# Patient Record
Sex: Male | Born: 1975 | Hispanic: Yes | Marital: Single | Smoking: Current some day smoker
Health system: Southern US, Community
[De-identification: ages and names within clinical notes are randomized; demographics above are authoritative.]

## PROBLEM LIST (undated history)

## (undated) DIAGNOSIS — L509 Urticaria, unspecified: Secondary | ICD-10-CM

## (undated) HISTORY — PX: SHOULDER CLOSED REDUCTION: SHX1051

## (undated) HISTORY — DX: Urticaria, unspecified: L50.9

---

## 2000-12-12 ENCOUNTER — Emergency Department (HOSPITAL_COMMUNITY): Admission: EM | Admit: 2000-12-12 | Discharge: 2000-12-12 | Payer: Self-pay | Admitting: Emergency Medicine

## 2000-12-12 ENCOUNTER — Encounter: Payer: Self-pay | Admitting: Emergency Medicine

## 2003-06-12 ENCOUNTER — Ambulatory Visit (HOSPITAL_COMMUNITY): Admission: RE | Admit: 2003-06-12 | Discharge: 2003-06-12 | Payer: Self-pay | Admitting: Internal Medicine

## 2003-06-12 IMAGING — CT CT PARANASAL SINUSES LIMITED
1 of 2 series · 12 of 28 positions shown, 15 images · non-contrast
Comparison: none

CLINICAL DATA: Frontal headaches, nasal drainage.
 LIMITED CT OF THE PARANASAL SINUSES WITHOUT CONTRAST 
 Prone coronal images of the paranasal sinuses demonstrate no mucosal thickening or air fluid level.  There is no bone abnormality.  There is nasal septal deviation to the left. 
 IMPRESSION
 1.  Clear paranasal sinuses. 
 2.  Nasal septal deviation to the left.

[Series 4: — · axial · 0.23mm/px · z∈[+1331,+1441]mm · 12 of 14 slices shown, 15 images]
[im 2/14  brain]
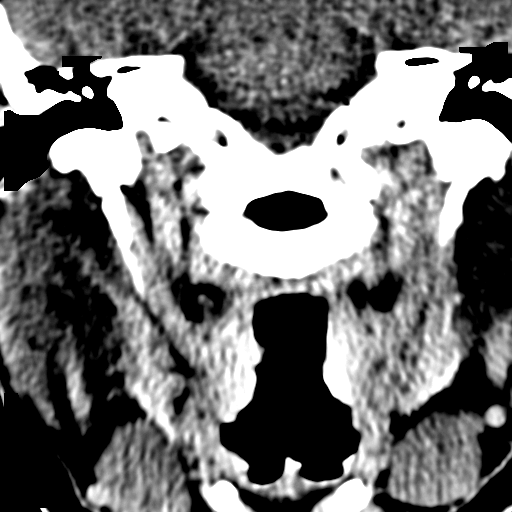
[im 2/14  bone]
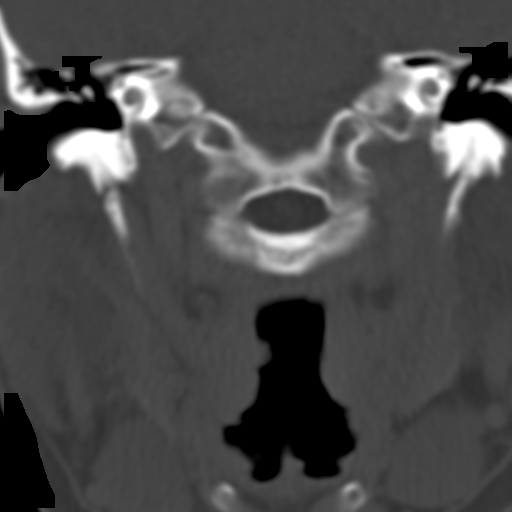
[im 3/14  bone]
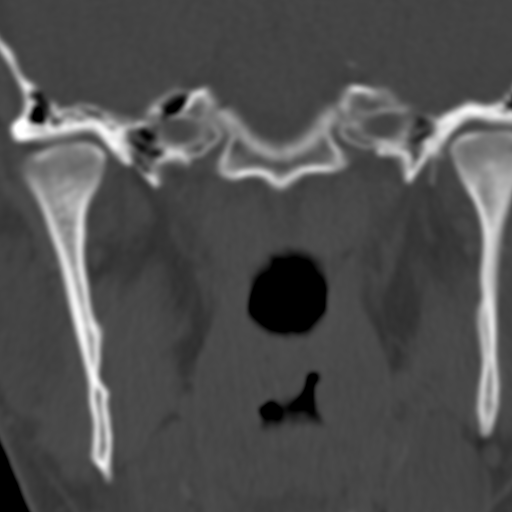
[im 4/14  bone]
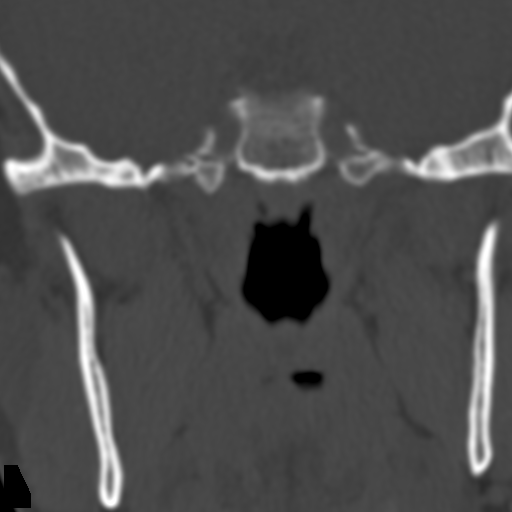
[im 5/14  bone]
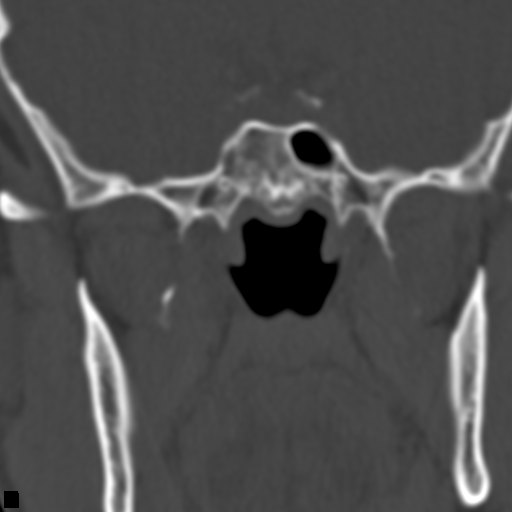
[im 6/14  brain]
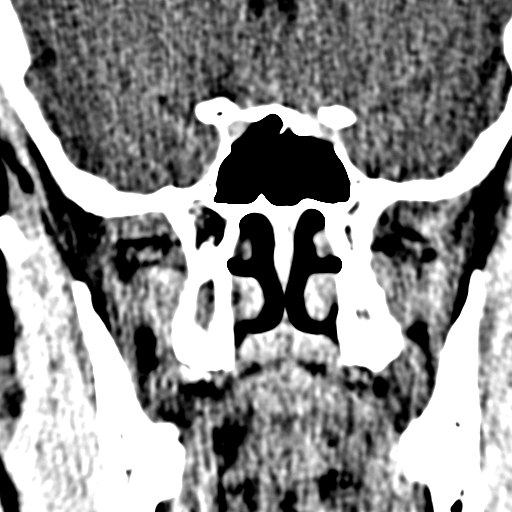
[im 6/14  bone]
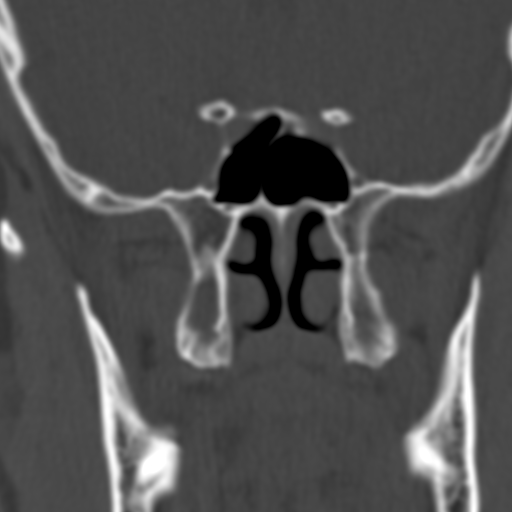
[im 7/14  bone]
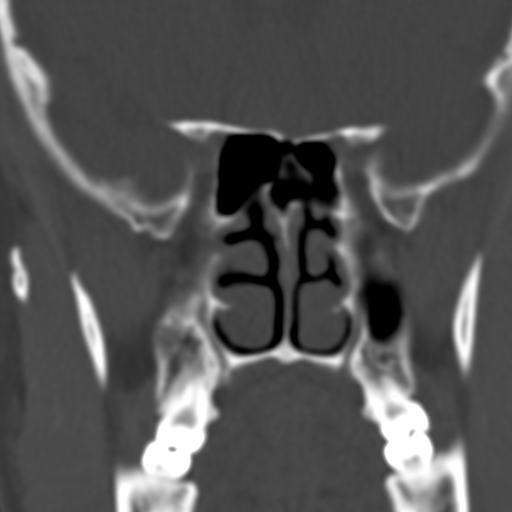
[im 8/14  bone]
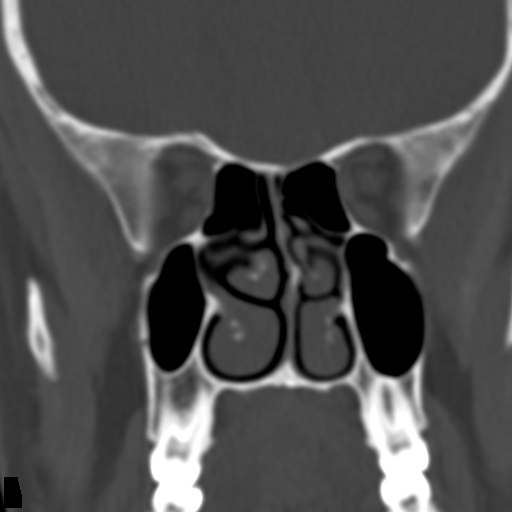
[im 9/14  bone]
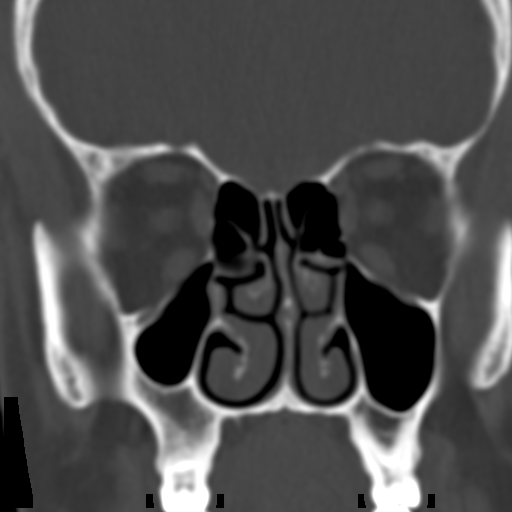
[im 10/14  brain]
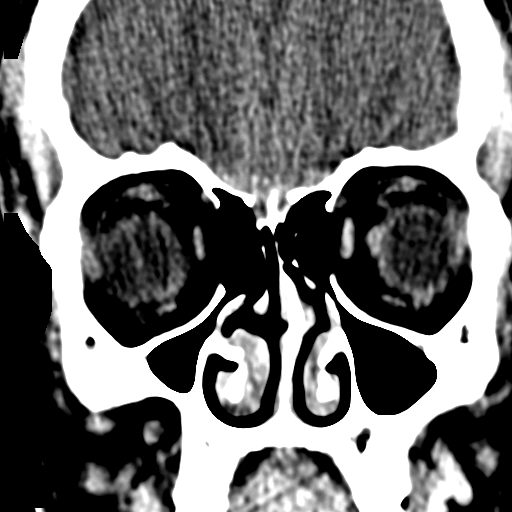
[im 10/14  bone]
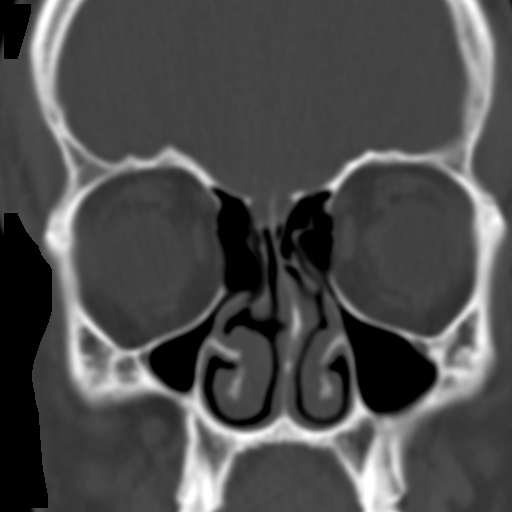
[im 11/14  bone]
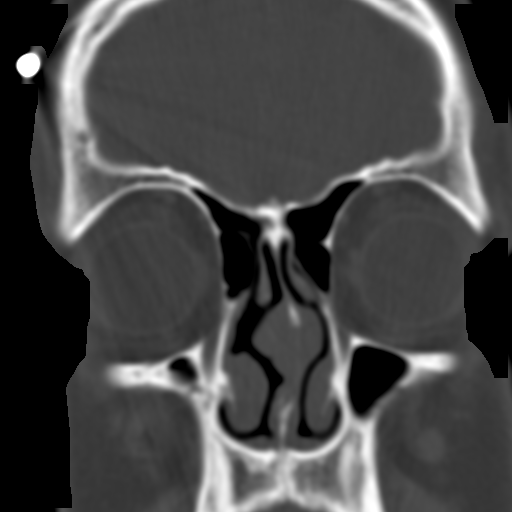
[im 12/14  bone]
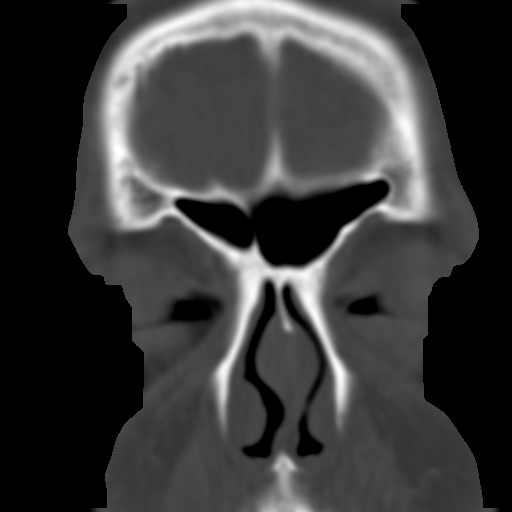
[im 13/14  bone]
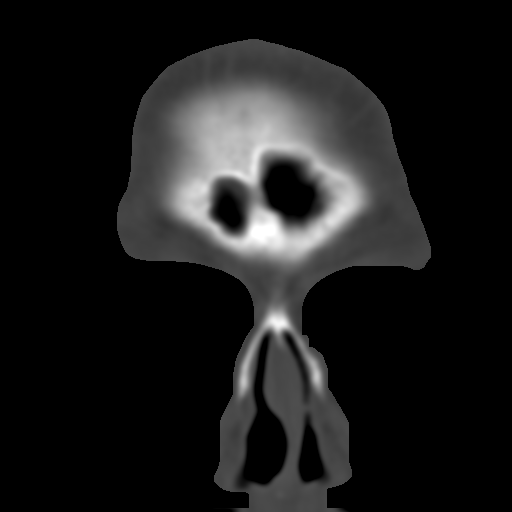

[12 of 28 positions shown; findings below may reference images not displayed]

## 2003-07-20 ENCOUNTER — Ambulatory Visit (HOSPITAL_COMMUNITY): Admission: RE | Admit: 2003-07-20 | Discharge: 2003-07-20 | Payer: Self-pay | Admitting: Nurse Practitioner

## 2003-08-16 ENCOUNTER — Ambulatory Visit (HOSPITAL_COMMUNITY): Admission: RE | Admit: 2003-08-16 | Discharge: 2003-08-16 | Payer: Self-pay | Admitting: Internal Medicine

## 2003-10-31 ENCOUNTER — Ambulatory Visit (HOSPITAL_BASED_OUTPATIENT_CLINIC_OR_DEPARTMENT_OTHER): Admission: RE | Admit: 2003-10-31 | Discharge: 2003-10-31 | Payer: Self-pay | Admitting: General Surgery

## 2003-10-31 ENCOUNTER — Ambulatory Visit (HOSPITAL_COMMUNITY): Admission: RE | Admit: 2003-10-31 | Discharge: 2003-10-31 | Payer: Self-pay | Admitting: General Surgery

## 2004-02-16 ENCOUNTER — Ambulatory Visit (HOSPITAL_COMMUNITY): Admission: RE | Admit: 2004-02-16 | Discharge: 2004-02-16 | Payer: Self-pay | Admitting: Internal Medicine

## 2010-07-24 ENCOUNTER — Encounter: Payer: Self-pay | Admitting: Internal Medicine

## 2013-01-23 ENCOUNTER — Ambulatory Visit: Payer: Self-pay | Admitting: Emergency Medicine

## 2013-01-23 VITALS — BP 104/90 | HR 81 | Temp 98.8°F | Resp 18 | Ht 69.0 in | Wt 227.0 lb

## 2013-01-23 DIAGNOSIS — L509 Urticaria, unspecified: Secondary | ICD-10-CM

## 2013-01-23 MED ORDER — PREDNISONE 20 MG PO TABS: ORAL_TABLET | ORAL | Status: DC

## 2013-01-23 NOTE — Patient Instructions (Addendum)
Ronchas  (Hives)  Las ronchas son reas de la piel inflamadas (hinchadas) rojas y que pican. Pueden cambiar de tamao y de ubicacin en el cuerpo. Las Armed forces operational officer y Geneticist, molecular durante algunas horas o das (ronchas agudas) o durante algunas semanas (ronchas crnicas). No pueden transmitirse de [place] persona a Theodoro Clock (no son contagiosas). Pueden empeorar al rascarse, hacer ejercicios y por estrs emocional.  CAUSAS   Reaccin alrgica a alimentos, aditivos o frmacos.  Infecciones, incluso el resfro comn.  Enfermedades, como la vasculitis, el lupus o la enfermedad tiroidea.  Exposicin al sol, al calor o al fro.  La prctica de ejercicios.  El estrs.  El contacto con algunas sustancias qumicas. SNTOMAS   Zonas hinchadas, rojas o blancas, sobre la piel. Las ronchas pueden cambiar de [place], forma, [place] y Armed forces logistics/support/administrative officer.  Picazn.  Hinchazn de las [employer] y [place]. Esto puede ocurrir si las ronchas se desarrollan en capas profundas de la piel. DIAGNSTICO  El mdico puede diagnosticar el problema haciendo un examen fsico. Conley Rolls indicar anlisis de sangre o un estudio de la piel para Production assistant, radio causa. En algunos casos, no puede determinarse la causa.  TRATAMIENTO  Los casos leves generalmente mejoran con medicamentos como los antihistamnicos. Los casos ms graves pueden requerir una inyeccin de epinefrina de Associate Professor. Si se conoce la causa de la urticaria, el tratamiento incluye evitar el factor desencadenante.  INSTRUCCIONES PARA EL CUIDADO EN EL HOGAR   Evite las causas que han desencadenado las ronchas.  Tome los antihistamnicos segn las indicaciones del mdico para reducir la gravedad de las ronchas. Generalmente se recomiendan los [employer] no son sedantes o con bajo efecto sedante. No conduzca vehculos mientras toma antihistamnicos.  Tome los medicamentos para la picazn exactamente como le indic el  mdico.  Use ropas sueltas.  Cumpla con todas las visitas de control, segn le indique su mdico. SOLICITE ATENCIN MDICA SI:   Siente una picazn intensa o persistente que no se calma con los medicamentos.  Le duelen las articulaciones o estn inflamadas. SOLICITE ATENCIN MDICA DE INMEDIATO SI:   Tiene fiebre.  Tiene la boca o los labios hinchados.  Tiene problemas para respirar o tragar.  Siente una opresin en la garganta o en el pecho.  Siente dolor abdominal. Estos problemas pueden ser los primeros signos de una reaccin alrgica que ponga en peligro la vida. Llame a los servicios de emergencia locales (911 en los [place]). ASEGRESE DE QUE:   Comprende estas instrucciones.  Controlar su enfermedad.  Solicitar ayuda de inmediato si no mejora o si empeora. Document Released: 06/20/2005 Document Revised: 12/20/2011 [facility] Patient Information 2014 [place], [place]. Take Zyrtec 10 mg one a day starting tomorrow. He can take Benadryl 1-2 tablets at night as needed. Take the prednisone as instructed.

## 2013-01-23 NOTE — Progress Notes (Signed)
  Subjective:    Patient ID: Jay Ruiz, male    DOB: September 04, 1975, 37 y.o.   MRN: [id number]  HPI patient was in his usual state of health until yesterday when around 12 noon he ate tacos from a booth at the work site . 2 hours later he developed severe itching and discomfort in his axilla of both arms. He also felt some itching in the back of his right ear. He did not have any shortness of breath. He did not have any wheezing. He did not feel any swelling or discomfort in his throat. He did not develop this on his extremities or trunk. 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 The area of hair growth in both arms are spared. There is also redness and swelling behind the right ear. His throat is normal his chest is clear to auscultation and percussion.        Assessment & Plan:  Patient given 60 mg of Prelone here in the office. Patient given 10 mg of Zyrtec here in the office.  He will be  on Zyrtec 10 mg daily and a prednisone taper over the next week. He was told to avoid eating any tacos from this particular vendor.Marland Kitchen

## 2016-08-26 DIAGNOSIS — K644 Residual hemorrhoidal skin tags: Secondary | ICD-10-CM | POA: Insufficient documentation

## 2016-08-26 DIAGNOSIS — K219 Gastro-esophageal reflux disease without esophagitis: Secondary | ICD-10-CM | POA: Insufficient documentation

## 2016-08-26 DIAGNOSIS — L720 Epidermal cyst: Secondary | ICD-10-CM | POA: Insufficient documentation

## 2016-09-16 DIAGNOSIS — B353 Tinea pedis: Secondary | ICD-10-CM | POA: Insufficient documentation

## 2016-09-16 DIAGNOSIS — L301 Dyshidrosis [pompholyx]: Secondary | ICD-10-CM | POA: Insufficient documentation

## 2016-09-16 HISTORY — DX: Dyshidrosis (pompholyx): L30.1

## 2016-11-29 DIAGNOSIS — R001 Bradycardia, unspecified: Secondary | ICD-10-CM | POA: Insufficient documentation

## 2017-01-10 DIAGNOSIS — M19049 Primary osteoarthritis, unspecified hand: Secondary | ICD-10-CM | POA: Insufficient documentation

## 2017-08-28 DIAGNOSIS — R002 Palpitations: Secondary | ICD-10-CM | POA: Insufficient documentation

## 2017-10-23 DIAGNOSIS — F1411 Cocaine abuse, in remission: Secondary | ICD-10-CM | POA: Insufficient documentation

## 2017-10-23 DIAGNOSIS — F1011 Alcohol abuse, in remission: Secondary | ICD-10-CM | POA: Insufficient documentation

## 2017-10-23 DIAGNOSIS — I209 Angina pectoris, unspecified: Secondary | ICD-10-CM | POA: Insufficient documentation

## 2018-01-29 DIAGNOSIS — E785 Hyperlipidemia, unspecified: Secondary | ICD-10-CM | POA: Insufficient documentation

## 2018-05-24 ENCOUNTER — Encounter (HOSPITAL_COMMUNITY): Payer: Self-pay | Admitting: *Deleted

## 2018-05-24 ENCOUNTER — Other Ambulatory Visit: Payer: Self-pay

## 2018-05-24 ENCOUNTER — Emergency Department (HOSPITAL_COMMUNITY)
Admission: EM | Admit: 2018-05-24 | Discharge: 2018-05-24 | Disposition: A | Discharge status: Home / Self Care | Payer: Self-pay | Attending: Emergency Medicine | Admitting: Emergency Medicine

## 2018-05-24 ENCOUNTER — Emergency Department (HOSPITAL_COMMUNITY): Payer: Self-pay

## 2018-05-24 DIAGNOSIS — M5442 Lumbago with sciatica, left side: Secondary | ICD-10-CM | POA: Insufficient documentation

## 2018-05-24 DIAGNOSIS — X500XXA Overexertion from strenuous movement or load, initial encounter: Secondary | ICD-10-CM | POA: Insufficient documentation

## 2018-05-24 DIAGNOSIS — F17200 Nicotine dependence, unspecified, uncomplicated: Secondary | ICD-10-CM | POA: Insufficient documentation

## 2018-05-24 DIAGNOSIS — Y990 Civilian activity done for income or pay: Secondary | ICD-10-CM | POA: Insufficient documentation

## 2018-05-24 DIAGNOSIS — Y9389 Activity, other specified: Secondary | ICD-10-CM | POA: Insufficient documentation

## 2018-05-24 DIAGNOSIS — Y9289 Other specified places as the place of occurrence of the external cause: Secondary | ICD-10-CM | POA: Insufficient documentation

## 2018-05-24 MED ORDER — OXYCODONE-ACETAMINOPHEN 5-325 MG PO TABS
1.0000 | ORAL_TABLET | Freq: Once | ORAL | Status: AC
  Administered 2018-05-24: 1 via ORAL
  Filled 2018-05-24: qty 1

## 2018-05-24 MED ORDER — PREDNISONE 10 MG (21) PO TBPK: ORAL_TABLET | Freq: Every day | ORAL | 0 refills | Status: DC

## 2018-05-24 NOTE — ED Provider Notes (Signed)
[facility] Provider Note   CSN: [id number] Arrival date & time: 05/24/18  0741     History   Chief Complaint Chief Complaint  Patient presents with  . Back Pain    HPI Jay Ruiz is a 42 y.o. male.  HPI 42 year old male, with PMH of HTN, presents with back pain.  Patient states pain started suddenly after lifting something heavy at work.  He describes pain as low back pain radiating down bilateral legs with the pain worst on the left leg than the right leg.  He denies any associated saddle anesthesias, bowel or bladder incontinence, overflow incontinence.  He states pain is worse with ambulating.  He denies any numbness, tingling, difficulty ambulating, decreased range of motion of bilateral lower extremities.  He denies any direct injury or trauma to the back.  He denies any fevers, chills, nausea, vomiting, chest pain, shortness of breath, abdominal pain, dysuria, hematuria.  History reviewed. No pertinent past medical history.  There are no active problems to display for this patient.   Past Surgical History:  Procedure Laterality Date  . SHOULDER CLOSED REDUCTION          Home Medications    Prior to Admission medications   Medication Sig Start Date End Date Taking? Authorizing Provider  predniSONE (DELTASONE) 20 MG tablet On Thursday, 01/24/2013 take 3 tablets a day for 2 days then 2 tablets a day for 3 days then 1 tablet a day for 3 days 01/23/13   Collene Gobbleaub, Steven A, MD    Family History No family history on file.  Social History Social History   Tobacco Use  . Smoking status: Current Some Day Smoker  . Smokeless tobacco: Never Used  Substance Use Topics  . Alcohol use: Yes  . Drug use: Yes     Allergies   Patient has no known allergies.   Review of Systems Review of Systems  Constitutional: Negative for chills and fever.  HENT: Negative for rhinorrhea.   Respiratory: Negative for shortness of breath.     Cardiovascular: Negative for chest pain.  Gastrointestinal: Negative for abdominal pain, nausea and vomiting.  Genitourinary: Negative for hematuria.  Musculoskeletal: Positive for back pain.  Skin: Negative for rash and wound.     Physical Exam Updated Vital Signs BP 126/84 (BP Location: Right Arm)   Pulse 73   Temp 99.1 F (37.3 C) (Oral)   Resp 16   Ht 5\' 10"  (1.778 m)   Wt 97.1 kg   SpO2 98%   BMI 30.71 kg/m   Physical Exam  Constitutional: He is oriented to person, place, and time. He appears well-developed and well-nourished.  HENT:  Head: Normocephalic and atraumatic.  Eyes: Conjunctivae and EOM are normal.  Neck: Neck supple.  Cardiovascular: Normal rate, regular rhythm and normal heart sounds.  No murmur heard. Pulmonary/Chest: Effort normal and breath sounds normal. No respiratory distress. He has no wheezes. He has no rales.  Abdominal: Soft. Bowel sounds are normal. He exhibits no distension. There is no tenderness.  Musculoskeletal: Normal range of motion. He exhibits no tenderness or deformity.       Arms: No tenderness to palpation over cervical or thoracic spine.  Straight leg raise positive on the left side.  Neurological: He is alert and oriented to person, place, and time.  Skin: Skin is warm and dry. No rash noted. No erythema.  Psychiatric: He has a normal mood and affect. His behavior is normal.  Nursing  note and vitals reviewed.    ED Treatments / Results  Labs (all labs ordered are listed, but only abnormal results are displayed) Labs Reviewed - No data to display  EKG None  Radiology Dg Lumbar Spine Complete  Result Date: 05/24/2018 CLINICAL DATA:  Lifting injury.  Low back pain EXAM: LUMBAR SPINE - COMPLETE 4+ VIEW COMPARISON:  CT 02/16/2004 FINDINGS: Degenerative disc disease at L4-5 with disc space narrowing and spurring. Degenerative facet disease in the lower lumbar spine. No fracture or malalignment. SI joints are symmetric and  unremarkable. IMPRESSION: Degenerative changes in the lower lumbar spine. No acute bony abnormality. Electronically Signed   By: Charlett Nose M.D.   On: 05/24/2018 08:25    Procedures Procedures (including critical care time)  Medications Ordered in ED Medications  oxyCODONE-acetaminophen (PERCOCET/ROXICET) 5-325 MG per tablet 1 tablet (1 tablet Oral Given 05/24/18 0806)     Initial Impression / Assessment and Plan / ED Course  I have reviewed the triage vital signs and the nursing notes.  Pertinent labs & imaging results that were available during my care of the patient were reviewed by me and considered in my medical decision making (see chart for details).     Comfortably by, no acute distress, nontoxic, non-lethargic.  Vital signs stable.  Patient neurovascularly intact bilateral lower extremities.  No evidence of cauda equina, epidural abscess.  Imaging shows no acute process, fractures.  Concern for lumbar radiculopathy with positive straight leg raise.  Will start patient on steroid taper.  Encourage follow-up with primary care for further evaluation.   At this time there does not appear to be any evidence of an acute emergency medical condition and the patient appears stable for discharge with appropriate outpatient follow up.Diagnosis was discussed with patient who verbalizes understanding and is agreeable to discharge.    Final Clinical Impressions(s) / ED Diagnoses   Final diagnoses:  None    ED Discharge Orders    None       Clayborne Artist, PA-C 05/24/18 1511    Sabas Sous, MD 05/24/18 1551

## 2018-05-24 NOTE — ED Triage Notes (Signed)
States he was lifiting something heavy at work and his lower back is hurting. States pain increases with movement.

## 2018-05-24 NOTE — Discharge Instructions (Signed)
He was seen today for back pain.  Your x-rays were reassuring.  Please take steroids as prescribed.  Take Tylenol as needed for pain.  Follow-up with your primary care provider in 2 days for continued evaluation.  Return to the ED immediately for new or worsening symptoms, such as difficulty peeing, numbness of the groin, difficulty walking, fevers or any concerns at all.

## 2018-05-25 DIAGNOSIS — M9979 Connective tissue and disc stenosis of intervertebral foramina of abdomen and other regions: Secondary | ICD-10-CM | POA: Insufficient documentation

## 2018-06-06 ENCOUNTER — Encounter (INDEPENDENT_AMBULATORY_CARE_PROVIDER_SITE_OTHER): Payer: Self-pay | Admitting: Family Medicine

## 2018-06-06 ENCOUNTER — Ambulatory Visit (INDEPENDENT_AMBULATORY_CARE_PROVIDER_SITE_OTHER): Payer: Self-pay | Admitting: Family Medicine

## 2018-06-06 DIAGNOSIS — M545 Low back pain, unspecified: Secondary | ICD-10-CM

## 2018-06-06 DIAGNOSIS — G8929 Other chronic pain: Secondary | ICD-10-CM

## 2018-06-06 DIAGNOSIS — M5136 Other intervertebral disc degeneration, lumbar region: Secondary | ICD-10-CM | POA: Insufficient documentation
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
## 2018-06-06 NOTE — Patient Instructions (Signed)
   Home strengthening exercises 2-3 days per week.  Physical therapy if not improving.  MRI of spine if keeps getting worse.

## 2018-09-11 DIAGNOSIS — F439 Reaction to severe stress, unspecified: Secondary | ICD-10-CM | POA: Insufficient documentation

## 2018-09-11 DIAGNOSIS — G44209 Tension-type headache, unspecified, not intractable: Secondary | ICD-10-CM | POA: Insufficient documentation

## 2018-09-11 DIAGNOSIS — F5104 Psychophysiologic insomnia: Secondary | ICD-10-CM | POA: Insufficient documentation

## 2018-11-23 DIAGNOSIS — K610 Anal abscess: Secondary | ICD-10-CM | POA: Insufficient documentation

## 2019-01-03 DIAGNOSIS — B354 Tinea corporis: Secondary | ICD-10-CM | POA: Insufficient documentation

## 2019-01-21 DIAGNOSIS — B079 Viral wart, unspecified: Secondary | ICD-10-CM | POA: Insufficient documentation

## 2019-09-03 DIAGNOSIS — M25579 Pain in unspecified ankle and joints of unspecified foot: Secondary | ICD-10-CM | POA: Insufficient documentation

## 2019-09-03 DIAGNOSIS — M5416 Radiculopathy, lumbar region: Secondary | ICD-10-CM | POA: Insufficient documentation

## 2019-09-03 DIAGNOSIS — R739 Hyperglycemia, unspecified: Secondary | ICD-10-CM | POA: Insufficient documentation

## 2019-09-16 DIAGNOSIS — M79641 Pain in right hand: Secondary | ICD-10-CM | POA: Insufficient documentation

## 2019-09-25 DIAGNOSIS — J302 Other seasonal allergic rhinitis: Secondary | ICD-10-CM | POA: Insufficient documentation

## 2019-09-25 DIAGNOSIS — Z8616 Personal history of COVID-19: Secondary | ICD-10-CM | POA: Insufficient documentation

## 2020-02-10 DIAGNOSIS — R1030 Lower abdominal pain, unspecified: Secondary | ICD-10-CM | POA: Insufficient documentation

## 2020-02-10 DIAGNOSIS — K921 Melena: Secondary | ICD-10-CM | POA: Insufficient documentation

## 2020-02-10 DIAGNOSIS — K648 Other hemorrhoids: Secondary | ICD-10-CM | POA: Insufficient documentation

## 2020-02-26 DIAGNOSIS — N503 Cyst of epididymis: Secondary | ICD-10-CM | POA: Insufficient documentation

## 2020-03-26 DIAGNOSIS — R5383 Other fatigue: Secondary | ICD-10-CM | POA: Insufficient documentation

## 2020-03-26 DIAGNOSIS — Z87820 Personal history of traumatic brain injury: Secondary | ICD-10-CM | POA: Insufficient documentation

## 2020-03-26 DIAGNOSIS — R519 Headache, unspecified: Secondary | ICD-10-CM | POA: Insufficient documentation

## 2020-07-03 ENCOUNTER — Encounter (HOSPITAL_COMMUNITY): Payer: Self-pay | Admitting: Emergency Medicine

## 2020-07-03 ENCOUNTER — Ambulatory Visit (HOSPITAL_COMMUNITY)
Admission: EM | Admit: 2020-07-03 | Discharge: 2020-07-03 | Disposition: A | Discharge status: Home / Self Care | Payer: Self-pay | Attending: Student | Admitting: Student

## 2020-07-03 DIAGNOSIS — L509 Urticaria, unspecified: Secondary | ICD-10-CM

## 2020-07-03 MED ORDER — METHYLPREDNISOLONE SODIUM SUCC 125 MG IJ SOLR
INTRAMUSCULAR | Status: AC
  Filled 2020-07-03: qty 2

## 2020-07-03 MED ORDER — PREDNISONE 20 MG PO TABS: 40.0000 mg | ORAL_TABLET | Freq: Every day | ORAL | 0 refills | Status: DC

## 2020-07-03 MED ORDER — METHYLPREDNISOLONE SODIUM SUCC 125 MG IJ SOLR
80.0000 mg | Freq: Once | INTRAMUSCULAR | Status: AC
  Administered 2020-07-03: 80 mg via INTRAMUSCULAR

## 2020-07-03 NOTE — Discharge Instructions (Addendum)
-  Start the prednisone tomorrow. Take 2 pills (40mg ) daily for 5 days.  -Take benedryl OTC. This will help with itching and discomfort.

## 2020-07-03 NOTE — ED Triage Notes (Signed)
PT C/O: rash on his neck, arms, chest, back onset this morning  Reports he ate [place] food that was new to him last night.   DENIES: f/v/n/d, new hygiene products, dyspnea, SOB  TAKING MEDS: Benadryl at 1400 today  A&O x4... NAD... Ambulatory

## 2020-07-03 NOTE — ED Provider Notes (Signed)
[facility]    CSN: [id number] Arrival date & time: 07/03/20  1552      History   Chief Complaint Chief Complaint  Patient presents with  . Rash    HPI Jay Ruiz is a 44 y.o. male presenting with rash for 1 day. States he ate [place] food yesterday and woke up this morning with itchy rash on neck and torso. Denies rash on face, arms, legs. Denies face/lips/tongue swelling, denies shortness of breath, denies wheezing, denies chest pain. Denies history of diabetes or blood sugar issues.   HPI  History reviewed. No pertinent past medical history.  There are no problems to display for this patient.   Past Surgical History:  Procedure Laterality Date  . SHOULDER CLOSED REDUCTION         Home Medications    Prior to Admission medications   Medication Sig Start Date End Date Taking? Authorizing Provider  predniSONE (DELTASONE) 20 MG tablet Take 2 tablets (40 mg total) by mouth daily for 5 days. 07/03/20 07/08/20 Yes Jay Martini, PA-C  amLODipine (NORVASC) 2.5 MG tablet TK 1 T PO  D 05/24/18   [provider]  ASPIRIN LOW DOSE 81 MG EC tablet TK 1 T PO QD 05/24/18   [provider]  nitroGLYCERIN (NITROSTAT) 0.4 MG SL tablet TNT OR AS DIRECTED 04/25/18   [provider]    Family History History reviewed. No pertinent family history.  Social History Social History   Tobacco Use  . Smoking status: Current Some Day Smoker  . Smokeless tobacco: Never Used  Substance Use Topics  . Alcohol use: Yes  . Drug use: Yes     Allergies   Patient has no known allergies.   Review of Systems Review of Systems  Skin: Positive for rash.  All other systems reviewed and are negative.    Physical Exam Triage Vital Signs ED Triage Vitals  Enc Vitals Group     BP 07/03/20 1643 116/70     Pulse Rate 07/03/20 1643 63     Resp 07/03/20 1643 18     Temp 07/03/20 1643 98.2 F (36.8 C)     Temp Source 07/03/20 1643 Oral      SpO2 --      Weight --      Height --      Head Circumference --      Peak Flow --      Pain Score 07/03/20 1642 0     Pain Loc --      Pain Edu? --      Excl. in GC? --    No data found.  Updated Vital Signs BP 116/70 (BP Location: Right Arm)   Pulse 63   Temp 98.2 F (36.8 C) (Oral)   Resp 18   Visual Acuity Right Eye Distance:   Left Eye Distance:   Bilateral Distance:    Right Eye Near:   Left Eye Near:    Bilateral Near:     Physical Exam Vitals reviewed.  Constitutional:      Appearance: Normal appearance.  Cardiovascular:     Rate and Rhythm: Normal rate and regular rhythm.     Heart sounds: Normal heart sounds.  Pulmonary:     Effort: Pulmonary effort is normal.     Breath sounds: Normal breath sounds.  Skin:    Comments: Urticarial rash with wheels and hives on neck and trunk. No rash on face, arms, legs. No swelling of  tongue/lips/face.   Neurological:     General: No focal deficit present.     Mental Status: He is alert and oriented to person, place, and time.  Psychiatric:        Mood and Affect: Mood normal.        Behavior: Behavior normal.        Thought Content: Thought content normal.        Judgment: Judgment normal.      UC Treatments / Results  Labs (all labs ordered are listed, but only abnormal results are displayed) Labs Reviewed - No data to display  EKG   Radiology No results found.  Procedures Procedures (including critical care time)  Medications Ordered in UC Medications  methylPREDNISolone sodium succinate (SOLU-MEDROL) 125 mg/2 mL injection 80 mg (has no administration in time range)    Initial Impression / Assessment and Plan / UC Course  I have reviewed the triage vital signs and the nursing notes.  Pertinent labs & imaging results that were available during my care of the patient were reviewed by me and considered in my medical decision making (see chart for details).       He does not have diabetes and  denies blood sugar issues ever.   Steroid injection today and he will start prednisone as below tomorrow. benedryl for symptomatic relief.   Final Clinical Impressions(s) / UC Diagnoses   Final diagnoses:  Urticaria     Discharge Instructions     -Start the prednisone tomorrow. Take 2 pills (40mg ) daily for 5 days.  -Take benedryl OTC. This will help with itching and discomfort.    ED Prescriptions    Medication Sig Dispense Auth. Provider   predniSONE (DELTASONE) 20 MG tablet Take 2 tablets (40 mg total) by mouth daily for 5 days. 10 tablet , PA-C     PDMP not reviewed this encounter.   Jay Martini, PA-C 07/03/20 1851

## 2020-07-04 ENCOUNTER — Telehealth (HOSPITAL_COMMUNITY): Payer: Self-pay | Admitting: Emergency Medicine

## 2020-07-04 MED ORDER — PREDNISONE 20 MG PO TABS: 40.0000 mg | ORAL_TABLET | Freq: Every day | ORAL | 0 refills | Status: AC

## 2020-07-05 ENCOUNTER — Ambulatory Visit (HOSPITAL_COMMUNITY)
Admission: EM | Admit: 2020-07-05 | Discharge: 2020-07-05 | Disposition: A | Discharge status: Home / Self Care | Payer: Self-pay | Attending: Family Medicine | Admitting: Family Medicine

## 2020-07-05 ENCOUNTER — Other Ambulatory Visit: Payer: Self-pay

## 2020-07-05 ENCOUNTER — Encounter (HOSPITAL_COMMUNITY): Payer: Self-pay

## 2020-07-05 DIAGNOSIS — R21 Rash and other nonspecific skin eruption: Secondary | ICD-10-CM

## 2020-07-05 MED ORDER — CLOBETASOL PROPIONATE 0.05 % EX OINT: 1.0000 "application " | TOPICAL_OINTMENT | Freq: Two times a day (BID) | CUTANEOUS | 0 refills | Status: AC

## 2020-07-05 MED ORDER — PREDNISONE 10 MG PO TABS: ORAL_TABLET | ORAL | 0 refills | Status: DC

## 2020-07-05 NOTE — ED Triage Notes (Signed)
Patient states he is still having the burning rash and it has worsened from previous visit. Pt is aox4 and ambulatory.

## 2020-07-05 NOTE — ED Provider Notes (Signed)
[facility]    CSN: [id number] Arrival date & time: 07/05/20  1009      History   Chief Complaint Chief Complaint  Patient presents with  . Rash    Since friday    HPI Jay Ruiz is a 45 y.o. male.   Patient refuses medical interpreter today, requests to use his daughter for translation who presents with him. He was seen 2 days ago for a rash that was mostly around neck and chest that itches and burns, started on prednisone for suspected allergic reaction with unknown cause but has not seen improvement. The rash has now spread party down arms and all down back and abdomen. Still burning and itching. Denies wheezing, SOB, CP, swollen throat. Using hydrocortisone cream and benadryl prn with minimal relief. No recent vaccines, no new med changes or diet changes, no recent travel.      History reviewed. No pertinent past medical history.  There are no problems to display for this patient.   Past Surgical History:  Procedure Laterality Date  . SHOULDER CLOSED REDUCTION         Home Medications    Prior to Admission medications   Medication Sig Start Date End Date Taking? Authorizing Provider  clobetasol ointment (TEMOVATE) 0.05 % Apply 1 application topically 2 (two) times daily. Take for no more than 1.5 weeks at a time 07/05/20  Yes Maurice March, Salley Hews, PA-C  predniSONE (DELTASONE) 10 MG tablet Take 6 tabs day one, 5 tabs day two, 4 tabs day three, etc 07/05/20  Yes Particia Nearing, PA-C  predniSONE (DELTASONE) 20 MG tablet Take 2 tablets (40 mg total) by mouth daily for 5 days. 07/04/20 07/09/20 Yes [name], PA-C  amLODipine (NORVASC) 2.5 MG tablet TK 1 T PO  D 05/24/18   [provider]  ASPIRIN LOW DOSE 81 MG EC tablet TK 1 T PO QD 05/24/18   [provider]  nitroGLYCERIN (NITROSTAT) 0.4 MG SL tablet TNT OR AS DIRECTED 04/25/18   [provider]    Family History History reviewed. No pertinent family  history.  Social History Social History   Tobacco Use  . Smoking status: Current Some Day Smoker  . Smokeless tobacco: Never Used  Vaping Use  . Vaping Use: Never used  Substance Use Topics  . Alcohol use: Yes  . Drug use: Yes     Allergies   Patient has no known allergies.   Review of Systems Review of Systems PER HPI    Physical Exam Triage Vital Signs ED Triage Vitals  Enc Vitals Group     BP 07/05/20 1036 121/77     Pulse Rate 07/05/20 1036 61     Resp 07/05/20 1036 18     Temp 07/05/20 1036 98.1 F (36.7 C)     Temp Source 07/05/20 1036 Oral     SpO2 07/05/20 1036 100 %     Weight --      Height --      Head Circumference --      Peak Flow --      Pain Score 07/05/20 1038 0     Pain Loc --      Pain Edu? --      Excl. in GC? --    No data found.  Updated Vital Signs BP 121/77 (BP Location: Right Arm)   Pulse 61   Temp 98.1 F (36.7 C) (Oral)   Resp 18   SpO2 100%  Visual Acuity Right Eye Distance:   Left Eye Distance:   Bilateral Distance:    Right Eye Near:   Left Eye Near:    Bilateral Near:     Physical Exam Vitals and nursing note reviewed.  Constitutional:      Appearance: Normal appearance.  HENT:     Head: Atraumatic.  Eyes:     Extraocular Movements: Extraocular movements intact.     Conjunctiva/sclera: Conjunctivae normal.  Cardiovascular:     Rate and Rhythm: Normal rate and regular rhythm.  Pulmonary:     Effort: Pulmonary effort is normal.     Breath sounds: Normal breath sounds. No wheezing or rales.  Musculoskeletal:        General: Normal range of motion.     Cervical back: Normal range of motion and neck supple.  Skin:    General: Skin is warm and dry.     Findings: Rash (erythematous maculopapular rash across abdomen, back, neck and upper arms) present.  Neurological:     General: No focal deficit present.     Mental Status: He is oriented to person, place, and time.  Psychiatric:        Mood and Affect:  Mood normal.        Thought Content: Thought content normal.        Judgment: Judgment normal.      UC Treatments / Results  Labs (all labs ordered are listed, but only abnormal results are displayed) Labs Reviewed - No data to display  EKG   Radiology No results found.  Procedures Procedures (including critical care time)  Medications Ordered in UC Medications - No data to display  Initial Impression / Assessment and Plan / UC Course  I have reviewed the triage vital signs and the nursing notes.  Pertinent labs & imaging results that were available during my care of the patient were reviewed by me and considered in my medical decision making (see chart for details).     Unclear etiology of rash, will extend prednisone with taper once 5 days is complete and give clobetasol to use topically. Discussed avoiding possible irritants and scented products, and to moisturize well twice daily with unscented cream. Dermatology information given if not resolving.   Final Clinical Impressions(s) / UC Diagnoses   Final diagnoses:  Rash   Discharge Instructions   None    ED Prescriptions    Medication Sig Dispense Auth. Provider   predniSONE (DELTASONE) 10 MG tablet Take 6 tabs day one, 5 tabs day two, 4 tabs day three, etc 21 tablet Particia Nearing, PA-C   clobetasol ointment (TEMOVATE) 0.05 % Apply 1 application topically 2 (two) times daily. Take for no more than 1.5 weeks at a time 120 g Particia Nearing, [place]     PDMP not reviewed this encounter.   Particia Nearing, [place] 07/05/20 1131

## 2020-08-26 ENCOUNTER — Ambulatory Visit (INDEPENDENT_AMBULATORY_CARE_PROVIDER_SITE_OTHER): Payer: Self-pay | Admitting: Allergy

## 2020-08-26 ENCOUNTER — Other Ambulatory Visit: Payer: Self-pay

## 2020-08-26 ENCOUNTER — Encounter: Payer: Self-pay | Admitting: Allergy

## 2020-08-26 VITALS — BP 124/74 | HR 58 | Temp 97.2°F | Resp 18 | Wt 227.6 lb

## 2020-08-26 DIAGNOSIS — L509 Urticaria, unspecified: Secondary | ICD-10-CM

## 2020-08-26 NOTE — Progress Notes (Signed)
New Patient Note see also much better she said  RE: Jay Ruiz MRN: [id number] DOB: October 02, 1975 Date of Office Visit: 08/26/2020  Referring provider: Marya Landry Primary care provider: System, Provider Not In  Chief Complaint: Rash  History of present illness: Jay Ruiz is a 45 y.o. male presenting today for consultation for dermatitis.  He started having a rash on Jul 03, 2020 on his neck that spread to chest and back.  He is not sure what triggered this.  Pictures provided today of the rash are consistent with urticaria.  He states the rash lasted for about 2 weeks.  The rash was itchy.  No marks or bruising left behind once resolved.  No swelling.  Denies any GI, respiratory or CV related symptoms.  He did feel his arthritis joint aches/pains was worse with the rash.  No fevers.  About 2 weeks prior to the onset of rash he states his household family members were diagnosed with Covid.  He had headache only at the time but did not test thus not sure if he had Covid or not.  He states he ate fish around noon the day prior to hive onset and hasn't had any fish since. He is not sure if he is allergic to fish but states he would eat fish often prior without issue.  He had chinese food consisting of steak, rice and veggies with white sauce in the evening and the next morning he woke with the hives.  He states he eats red meat about 2-3 times a week and has never had any issues.  A day before the onset of rash he went hunting but does not believe he was bitten/stung by anything.  He does report having previous tick/chigger bites in the past.  He denies any change in soaps/lotions/detergents.  He went to [facility] and was prescribed antihistamine (hydroxyzine and benadryl) and a cream he does not recall and states nothing helped.    Review of systems in the past 4 weeks: Review of Systems  Constitutional: Negative.   HENT: Negative.   Eyes: Negative.   Respiratory: Negative.    Cardiovascular: Negative.   Gastrointestinal: Negative.   Musculoskeletal: Negative.   Skin: Negative.   Neurological: Negative.     All other systems negative unless noted above in HPI  Past medical history: Past Medical History:  Diagnosis Date  . Urticaria     Past surgical history: Past Surgical History:  Procedure Laterality Date  . SHOULDER CLOSED REDUCTION      Family history:  Family History  Problem Relation Age of Onset  . Allergic rhinitis Neg Hx   . Asthma Neg Hx   . Angioedema Neg Hx   . Atopy Neg Hx   . Eczema Neg Hx   . Immunodeficiency Neg Hx   . Urticaria Neg Hx     Social history: Lives in a home without pets in the home.  There is a dog outside the home.  There is no concern for water damage, mildew in the home.  He denies a smoking history.  He is exposed to dust at work.  Medication List: Current Outpatient Medications  Medication Sig Dispense Refill  . famotidine (PEPCID) 20 MG tablet famotidine 20 mg tablet  TAKE 1 TABLET BY MOUTH TWICE DAILY    . amLODipine (NORVASC) 2.5 MG tablet TK 1 T PO  D (Patient not taking: Reported on 08/26/2020)  3  . ASPIRIN LOW DOSE 81 MG  EC tablet TK 1 T PO QD (Patient not taking: Reported on 08/26/2020)  2  . clobetasol ointment (TEMOVATE) 0.05 % Apply 1 application topically 2 (two) times daily. Take for no more than 1.5 weeks at a time (Patient not taking: Reported on 08/26/2020) 120 g 0  . nitroGLYCERIN (NITROSTAT) 0.4 MG SL tablet TNT OR AS DIRECTED (Patient not taking: Reported on 08/26/2020)  3  . predniSONE (DELTASONE) 10 MG tablet Take 6 tabs day one, 5 tabs day two, 4 tabs day three, etc (Patient not taking: Reported on 08/26/2020) 21 tablet 0   No current facility-administered medications for this visit.    Known medication allergies: No Known Allergies   Physical examination: Blood pressure 124/74, pulse (!) 58, temperature (!) 97.2 F (36.2 C), resp. rate 18, weight 227 lb 9.6 oz (103.2 kg), SpO2 99  %.  General: Alert, interactive, in no acute distress. HEENT: PERRLA, TMs pearly gray, turbinates non-edematous without discharge, post-pharynx non erythematous. Neck: Supple without lymphadenopathy. Lungs: Clear to auscultation without wheezing, rhonchi or rales. {no increased work of breathing. CV: Normal S1, S2 without murmurs. Abdomen: Nondistended, nontender. Skin: Warm and dry, without lesions or rashes. Extremities:  No clubbing, cyanosis or edema. Neuro:   Grossly intact.  Diagnositics/Labs:  Allergy testing: Skin prick testing to fish panel is negative with a positive histamine control Allergy testing results were read and interpreted by provider, documented by clinical staff.   Assessment and plan: Urticaria, acute - at this time etiology of hives could be food driven vs illness driven.  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  Alpha gal allergy is usually developed after having past tick or chigger bites.  Alpha gal allergy occurs after red meat ingestion but symptoms do not usually appear until 3-4 hours or later after ingestion and reaction does not have to occur with every red meat ingestion.   Will obtain alpha gal panel which is a lab to test for this.  Recommend avoiding red meat in the diet until lab returns.   If it is positive then will prescribe an Epipen to have in case of severe allergic reaction.  - illness is also a very common cause of hives.  It is very likely that you had Covid the same time your family did as you do report having headache.  This could also be cause of hives.   - skin testing to fish panel today is negative thus not concerned for food allergy to fish.  - should significant symptoms recur or new symptoms occur, a journal is to be kept recording any foods eaten, beverages consumed, medications taken, activities performed, and environmental conditions within a 6 hour time period prior to the onset of  symptoms.  - if hives return recommend use of high-dose antihistamine regimen of Zyrtec 10mg  1 tab twice a day with Pepcid 20mg  1 tab twice a day.   Both of these are over-the-counter medications.   In general hives can be caused by a variety of different triggers including illness/infection, foods, medications, stings, exercise, pressure, vibrations, extremes of temperature to name a few however majority of the time there is no identifiable trigger.  If hives recur and last longer than 4-6 weeks in duration then consider further laboratory work-up.    Follow-up in 6 months or sooner if needed  I appreciate the opportunity to take part in Eliza's care. Please do not hesitate to contact me with questions.  Sincerely,   Prudy Feeler, MD Allergy/Immunology [facility]

## 2020-08-26 NOTE — Patient Instructions (Addendum)
Hives, acute - at this time etiology of hives could be food driven vs illness driven.  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  Alpha gal allergy is usually developed after having past tick or chigger bites.  Alpha gal allergy occurs after red meat ingestion but symptoms do not usually appear until 3-4 hours or later after ingestion and reaction does not have to occur with every red meat ingestion.   Will obtain alpha gal panel which is a lab to test for this.  Recommend avoiding red meat in the diet until lab returns.   If it is positive then will prescribe an Epipen to have in case of severe allergic reaction.  - illness is also a very common cause of hives.  It is very likely that you had Covid the same time your family did as you do report having headache.  This could also be cause of hives.   - skin testing to fish panel today is negative thus not concerned for food allergy to fish.  - should significant symptoms recur or new symptoms occur, a journal is to be kept recording any foods eaten, beverages consumed, medications taken, activities performed, and environmental conditions within a 6 hour time period prior to the onset of symptoms.  - if hives return recommend use of high-dose antihistamine regimen of Zyrtec 10mg  1 tab twice a day with Pepcid 20mg  1 tab twice a day.   Both of these are over-the-counter medications.   In general hives can be caused by a variety of different triggers including illness/infection, foods, medications, stings, exercise, pressure, vibrations, extremes of temperature to name a few however majority of the time there is no identifiable trigger.  If hives recur and last longer than 4-6 weeks in duration then consider further laboratory work-up.   **We are ordering labs, so please allow 1-2 weeks for the results to come back.  With the newly implemented Cures Act, the labs might be visible to you at the same time that they  become visible to me.  However, I will not address the results until all of the results come  back, so please be patient.  In the meantime, continue avoiding your triggering food(s) in your After Visit Summary, including avoidance measures (if applicable), until you hear from me about the results.    Follow-up in 6 months or sooner if needed

## 2020-08-30 LAB — ALPHA-GAL PANEL
Allergen Lamb IgE: <0.1 kU/L
Beef IgE: <0.1 kU/L
IgE (Immunoglobulin E), Serum: 88 IU/mL (ref 6–495)
O215-IgE Alpha-Gal: 0.14 kU/L — AB
Pork IgE: <0.1 kU/L

## 2020-09-01 ENCOUNTER — Other Ambulatory Visit: Payer: Self-pay | Admitting: *Deleted

## 2020-09-01 MED ORDER — EPINEPHRINE 0.3 MG/0.3ML IJ SOAJ: 0.3000 mg | INTRAMUSCULAR | 1 refills | Status: DC | PRN

## 2020-12-16 NOTE — Patient Instructions (Signed)
Hives (urticaria) Take the least the medication while remaining hive free Cetirizine (Zyrtec) 10mg  twice a day and famotidine (Pepcid) 20 mg twice a day. If no symptoms for 7-14 days then decrease to. Cetirizine (Zyrtec) 10mg  twice a day and famotidine (Pepcid) 20 mg once a day.  If no symptoms for 7-14 days then decrease to. Cetirizine (Zyrtec) 10mg  twice a day.  If no symptoms for 7-14 days then decrease to. Cetirizine (Zyrtec) 10mg  once a day.  May use Benadryl (diphenhydramine) as needed for breakthrough hives       If symptoms return, then step up dosage  Keep a detailed symptom journal including foods eaten, contact with allergens, medications taken, weather changes.  Begin triamcinolone 0.1% ointment to red, itchy areas under your face twice a day as needed If you do not have any improvement in your symptoms, then we will order labs to help evaluate your hives  Alpha gal Continue to avoid mammalian meat and mammalian products.  In case of an allergic reaction, take Benadryl 50 mg every 4 hours, and if life-threatening symptoms occur, inject with EpiPen 0.3 mg.  Call the clinic if this treatment plan is not working well for you.  Follow up in 3 months or sooner if needed.

## 2020-12-16 NOTE — Progress Notes (Signed)
[address] Jay Ruiz [place] [address] Dept: [phone]  FOLLOW UP NOTE  Patient ID: Jay Ruiz, male    DOB: 1975-09-23  Age: 45 y.o. MRN: [id number] Date of Office Visit: 12/17/2020  Assessment  Chief Complaint: Allergic Reaction (Was diagnosed with alpha gal but still breaks out with bumps on his neck. He is not eating any red meats. He also has itchiness from his left ankle down. He has red bumps on his feet that appear after work and it itches all night. )  HPI Jay Ruiz is a 45 year old male who presents to the clinic for follow-up visit.  He was last seen in this clinic on 08/26/2020 by Dr. Delorse Lek for evaluation of urticaria and alpha gal allergy.  At today's visit he reports that he continues to experience raised and red itchy areas that occur around his throat and upper chest.  He reports these hives come and go and can last for several days before resolution.  The hives do not leave any residual markings upon resolution.  He denies weight loss, muscle pain, and drenching night sweats.  He does report about 5 months ago that he was diagnosed with arthritis, however, he has not currently taking any medication for this.  He is currently avoiding mammalian products, however, continues to experience hives.  He is currently taking famotidine 20 mg once a day.  He is not taking cetirizine.  He reports his feet frequently develop pinpoint, slightly raised, red, pruritic rash from the top of his sock to the end of his toes.  He reports this is worse when the temperature is hot and that humidity is high.  He has not tried any medications for this rash.  His current medications are listed in the chart. Of note, patient is currently uninsured.  After an extended conversation regarding the etiology of hives, it was decided that he would try the H1 and H2 blockade and, if no improvement, we will move forward with lab work.  Drug Allergies:  No Known Allergies  Physical Exam: BP  120/74   Pulse (!) 55   Temp 98.2 F (36.8 C)   Resp 18   Ht 5\' 10"  (1.778 m)   Wt 230 lb (104.3 kg)   SpO2 97%   BMI 33.00 kg/m    Physical Exam Vitals reviewed.  Constitutional:      Appearance: Normal appearance.  HENT:     Head: Normocephalic and atraumatic.     Right Ear: Tympanic membrane normal.     Left Ear: Tympanic membrane normal.     Nose:     Comments: Bilateral nares normal.  Pharynx normal.  Ears normal.  Eyes normal.    Mouth/Throat:     Pharynx: Oropharynx is clear.  Eyes:     Conjunctiva/sclera: Conjunctivae normal.  Cardiovascular:     Rate and Rhythm: Normal rate and regular rhythm.     Heart sounds: Normal heart sounds. No murmur heard. Pulmonary:     Effort: Pulmonary effort is normal.     Breath sounds: Normal breath sounds.     Comments: Lungs clear to auscultation Musculoskeletal:        General: Normal range of motion.     Cervical back: Normal range of motion and neck supple.  Skin:    General: Skin is warm.     Comments: Widely scattered, raised, erythematous areas noted on his neck and upper chest.  There are no hives noted on his stomach, back, or  arms.  His feet have a fine macular rash on the dorsal surface.  There are no open areas or drainage noted.  Neurological:     Mental Status: He is alert and oriented to person, place, and time.  Psychiatric:        Mood and Affect: Mood normal.        Behavior: Behavior normal.        Thought Content: Thought content normal.        Judgment: Judgment normal.      Assessment and Plan: 1. 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    Dispense:  60 tablet    Refill:  5   triamcinolone ointment (KENALOG) 0.1 %    Sig: Apply 1 application topically 2 (two) times daily.    Dispense:  80 g    Refill:  5   famotidine (PEPCID) 20 MG tablet     Sig: famotidine 20 mg tablet  TAKE 1 TABLET BY MOUTH TWICE DAILY    Dispense:  60 tablet    Refill:  5    Patient Instructions  Hives (urticaria) Take the least the medication while remaining hive free Cetirizine (Zyrtec) 10mg  twice a day and famotidine (Pepcid) 20 mg twice a day. If no symptoms for 7-14 days then decrease to. Cetirizine (Zyrtec) 10mg  twice a day and famotidine (Pepcid) 20 mg once a day.  If no symptoms for 7-14 days then decrease to. Cetirizine (Zyrtec) 10mg  twice a day.  If no symptoms for 7-14 days then decrease to. Cetirizine (Zyrtec) 10mg  once a day.  May use Benadryl (diphenhydramine) as needed for breakthrough hives       If symptoms return, then step up dosage  Keep a detailed symptom journal including foods eaten, contact with allergens, medications taken, weather changes.  Begin triamcinolone 0.1% ointment to red, itchy areas under your face twice a day as needed If you do not have any improvement in your symptoms, then we will order labs to help evaluate your hives  Alpha gal Continue to avoid mammalian meat and mammalian products.  In case of an allergic reaction, take Benadryl 50 mg every 4 hours, and if life-threatening symptoms occur, inject with EpiPen 0.3 mg.  Call the clinic if this treatment plan is not working well for you.  Follow up in 3 months or sooner if needed.  Return in about 3 months (around 03/19/2021), or if symptoms worsen or fail to improve.    Thank you for the opportunity to care for this patient.  Please do not hesitate to contact me with questions.  , FNP [facility]

## 2020-12-17 ENCOUNTER — Encounter: Payer: Self-pay | Admitting: Family Medicine

## 2020-12-17 ENCOUNTER — Other Ambulatory Visit: Payer: Self-pay

## 2020-12-17 ENCOUNTER — Ambulatory Visit (INDEPENDENT_AMBULATORY_CARE_PROVIDER_SITE_OTHER): Payer: Self-pay | Admitting: Family Medicine

## 2020-12-17 VITALS — BP 120/74 | HR 55 | Temp 98.2°F | Resp 18 | Ht 70.0 in | Wt 230.0 lb

## 2020-12-17 DIAGNOSIS — Z91018 Allergy to other foods: Secondary | ICD-10-CM

## 2020-12-17 DIAGNOSIS — L509 Urticaria, unspecified: Secondary | ICD-10-CM

## 2020-12-17 MED ORDER — TRIAMCINOLONE ACETONIDE 0.1 % EX OINT: 1.0000 "application " | TOPICAL_OINTMENT | Freq: Two times a day (BID) | CUTANEOUS | 5 refills | Status: DC

## 2020-12-17 MED ORDER — CETIRIZINE HCL 10 MG PO TABS: 10.0000 mg | ORAL_TABLET | Freq: Two times a day (BID) | ORAL | 5 refills | Status: DC

## 2020-12-17 MED ORDER — FAMOTIDINE 20 MG PO TABS: ORAL_TABLET | ORAL | 5 refills | Status: DC

## 2021-02-24 ENCOUNTER — Ambulatory Visit (INDEPENDENT_AMBULATORY_CARE_PROVIDER_SITE_OTHER): Payer: Self-pay | Admitting: Allergy

## 2021-02-24 ENCOUNTER — Other Ambulatory Visit: Payer: Self-pay

## 2021-02-24 ENCOUNTER — Encounter: Payer: Self-pay | Admitting: Allergy

## 2021-02-24 VITALS — BP 130/80 | HR 58 | Temp 98.2°F | Resp 16 | Ht 70.0 in | Wt 232.4 lb

## 2021-02-24 DIAGNOSIS — L508 Other urticaria: Secondary | ICD-10-CM

## 2021-02-24 DIAGNOSIS — Z91018 Allergy to other foods: Secondary | ICD-10-CM

## 2021-02-24 NOTE — Progress Notes (Signed)
Follow-up Note  RE: Jay Ruiz MRN: [id number] DOB: 1975/11/27 Date of Office Visit: 02/24/2021   History of present illness: Jay Ruiz is a 45 y.o. 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  He was last seen in the office on 12/17/2020 by our nurse practitioner Ambs.  Spanish interpreter present for translation.   He states he has had a few hives on his chest but states were not as bad as previous.  He states they were a lot less itchy and less of them.  He is taking zyrtec twice a day and pepcid once a day.  He states he has used the triamcinolone about twice on his chest when he had hives and not really sure if it made much difference.      He continues to avoid red meat and has had no accidental ingestions.  He has access to epinephrine but he has not needed to use.      Review of systems: Review of Systems  Constitutional: Negative.   HENT: Negative.    Eyes: Negative.   Respiratory: Negative.    Cardiovascular: Negative.   Gastrointestinal: Negative.   Musculoskeletal: Negative.   Skin:  Positive for itching and rash.  Neurological: Negative.    All other systems negative unless noted above in HPI  Past medical/social/surgical/family history have been reviewed and are unchanged unless specifically indicated below.  No changes  Medication List: Current Outpatient Medications  Medication Sig Dispense Refill   cetirizine (ZYRTEC) 10 MG tablet Take 1 tablet (10 mg total) by mouth in the morning and at bedtime. 60 tablet 5   EPINEPHrine (EPIPEN 2-PAK) 0.3 mg/0.3 mL IJ SOAJ injection Inject 0.3 mg into the muscle as needed for anaphylaxis. 2 each 1   famotidine (PEPCID) 20 MG tablet famotidine 20 mg tablet  TAKE 1 TABLET BY MOUTH TWICE DAILY 60 tablet 5   nitroGLYCERIN (NITROSTAT) 0.4 MG SL tablet   3   triamcinolone ointment (KENALOG) 0.1 % Apply 1 application topically 2 (two) times daily. 80 g 5   amLODipine (NORVASC)  2.5 MG tablet TK 1 T PO  D (Patient not taking: Reported on 02/24/2021)  3   ASPIRIN LOW DOSE 81 MG EC tablet TK 1 T PO QD (Patient not taking: Reported on 02/24/2021)  2   clobetasol ointment (TEMOVATE) 0.05 % Apply 1 application topically 2 (two) times daily. Take for no more than 1.5 weeks at a time (Patient not taking: No sig reported) 120 g 0   No current facility-administered medications for this visit.     Known medication allergies: No Known Allergies   Physical examination: Blood pressure 130/80, pulse (!) 58, temperature 98.2 F (36.8 C), temperature source Temporal, resp. rate 16, height 5\' 10"  (1.778 m), weight 232 lb 6 oz (105.4 kg), SpO2 98 %.  General: Alert, interactive, in no acute distress. HEENT: PERRLA, TMs pearly gray, turbinates non-edematous without discharge, post-pharynx non erythematous. Neck: Supple without lymphadenopathy. Lungs: Clear to auscultation without wheezing, rhonchi or rales. {no increased work of breathing. CV: Normal S1, S2 without murmurs. Abdomen: Nondistended, nontender. Skin: Warm and dry, without lesions or rashes. Extremities:  No clubbing, cyanosis or edema. Neuro:   Grossly intact.  Diagnositics/Labs: None today  Assessment and plan:   Hives (urticaria), chronic Take the least the medication while remaining hive free Cetirizine (Zyrtec) 10mg  twice a day and famotidine (Pepcid) 20 mg once a day. If no symptoms for 7-14 days  then decrease to. Cetirizine (Zyrtec) 10mg  once a day and famotidine (Pepcid) 20 mg once a day.  If no symptoms for 7-14 days then decrease to. Cetirizine (Zyrtec) 10mg  once a day.  May use Benadryl (diphenhydramine) as needed for breakthrough hives       If symptoms return, then step up dosage  Keep a detailed symptom journal including foods eaten, contact with allergens, medications taken, weather changes.  Can use triamcinolone 0.1% ointment to red, itchy areas on body if effective If you do not have any  improvement in your symptoms then would consider starting Xolair monthly injections  Alpha gal Continue to avoid mammalian meat and mammalian products.  In case of an allergic reaction, take Benadryl 50 mg every 4 hours, and if life-threatening symptoms occur, inject with EpiPen 0.3 mg.  Call the clinic if this treatment plan is not working well for you.  Follow up in 3-4 months or sooner if needed.  I appreciate the opportunity to take part in Traevon's care. Please do not hesitate to contact me with questions.  Sincerely,   , MD Allergy/Immunology [facility]

## 2021-02-24 NOTE — Patient Instructions (Addendum)
Hives (urticaria) Take the least the medication while remaining hive free Cetirizine (Zyrtec) 10mg  twice a day and famotidine (Pepcid) 20 mg once a day. If no symptoms for 7-14 days then decrease to. Cetirizine (Zyrtec) 10mg  once a day and famotidine (Pepcid) 20 mg once a day.  If no symptoms for 7-14 days then decrease to. Cetirizine (Zyrtec) 10mg  once a day.  May use Benadryl (diphenhydramine) as needed for breakthrough hives       If symptoms return, then step up dosage  Keep a detailed symptom journal including foods eaten, contact with allergens, medications taken, weather changes.  Can use triamcinolone 0.1% ointment to red, itchy areas on body if effective If you do not have any improvement in your symptoms then would consider starting Xolair monthly injections  Alpha gal Continue to avoid mammalian meat and mammalian products.  In case of an allergic reaction, take Benadryl 50 mg every 4 hours, and if life-threatening symptoms occur, inject with EpiPen 0.3 mg.  Call the clinic if this treatment plan is not working well for you.  Follow up in 3-4 months or sooner if needed.

## 2021-06-24 ENCOUNTER — Ambulatory Visit (INDEPENDENT_AMBULATORY_CARE_PROVIDER_SITE_OTHER): Payer: No Typology Code available for payment source | Admitting: Allergy

## 2021-06-24 ENCOUNTER — Other Ambulatory Visit: Payer: Self-pay

## 2021-06-24 ENCOUNTER — Encounter: Payer: Self-pay | Admitting: Allergy

## 2021-06-24 VITALS — BP 118/60 | HR 63 | Temp 97.6°F | Resp 20 | Ht 69.4 in | Wt 231.6 lb

## 2021-06-24 DIAGNOSIS — L508 Other urticaria: Secondary | ICD-10-CM

## 2021-06-24 DIAGNOSIS — Z91018 Allergy to other foods: Secondary | ICD-10-CM | POA: Diagnosis not present

## 2021-06-24 MED ORDER — FAMOTIDINE 20 MG PO TABS: 20.0000 mg | ORAL_TABLET | Freq: Every day | ORAL | 5 refills | Status: AC

## 2021-06-24 MED ORDER — CETIRIZINE HCL 10 MG PO TABS: 10.0000 mg | ORAL_TABLET | Freq: Every day | ORAL | 5 refills | Status: AC

## 2021-06-24 MED ORDER — EPINEPHRINE 0.3 MG/0.3ML IJ SOAJ: 0.3000 mg | INTRAMUSCULAR | 1 refills | Status: AC | PRN

## 2021-06-24 MED ORDER — TRIAMCINOLONE ACETONIDE 0.1 % EX OINT: 1.0000 "application " | TOPICAL_OINTMENT | Freq: Two times a day (BID) | CUTANEOUS | 5 refills | Status: AC

## 2021-06-24 NOTE — Addendum Note (Signed)
Addended by: Dollene Cleveland R on: 06/24/2021 05:41 PM   Modules accepted: Orders

## 2021-06-24 NOTE — Patient Instructions (Signed)
Hives (urticaria) Take the least the medication while remaining hive free Decrease to Zyrtec 1 tab in AM and Pepcid 1 tab in AM Stop evening dose of Zyrtec If you have return of hives or itching then go back to Zyrtec 1 tab in AM and PM  May use Benadryl (diphenhydramine) as needed for breakthrough hives       If symptoms return, then step up dosage  Keep a detailed symptom journal including foods eaten, contact with allergens, medications taken, weather changes.  Can use triamcinolone 0.1% ointment to red, itchy areas on body if effective If you do not have any improvement in your symptoms then would consider starting Xolair monthly injections  Alpha gal Continue to avoid mammalian meat and mammalian products.  In case of an allergic reaction, take Benadryl 50 mg every 4 hours, and if life-threatening symptoms occur, inject with EpiPen 0.3 mg.  Call the clinic if this treatment plan is not working well for you.  Follow up in 6 months or sooner if needed.

## 2021-06-24 NOTE — Progress Notes (Signed)
Follow-up Note  RE: SHEPARD KELTZ MRN: [id number] DOB: June 04, 1976 Date of Office Visit: 06/24/2021   History of present illness: Jay Ruiz is a 45 y.o. 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  He was last seen in the office on 02/24/2021 by myself.  He states that the Zyrtec is really working.  He takes Zyrtec in the morning and at night and takes Pepcid in the morning.  He states he has not had any hives with this regimen that he has been taking since his last visit.  He is quite pleased with this.  He has not needed to use triamcinolone ointment. He also states he is not quite avoiding red meat but does not eat often.  On a rare occasion he may have some red meat and he states he has not had a reaction.  He states he does have access to his epinephrine device.  Review of systems: Review of Systems  Constitutional: Negative.   HENT: Negative.    Eyes: Negative.   Respiratory: Negative.    Cardiovascular: Negative.   Musculoskeletal: Negative.   Skin: Negative.   Allergic/Immunologic: Negative.   Neurological: Negative.     All other systems negative unless noted above in HPI  Past medical/social/surgical/family history have been reviewed and are unchanged unless specifically indicated below.  No changes  Medication List: Current Outpatient Medications  Medication Sig Dispense Refill   EPINEPHrine (EPIPEN 2-PAK) 0.3 mg/0.3 mL IJ SOAJ injection Inject 0.3 mg into the muscle as needed for anaphylaxis. 2 each 1   famotidine (PEPCID) 20 MG tablet famotidine 20 mg tablet  TAKE 1 TABLET BY MOUTH TWICE DAILY 60 tablet 5   amLODipine (NORVASC) 2.5 MG tablet TK 1 T PO  D (Patient not taking: Reported on 02/24/2021)  3   ASPIRIN LOW DOSE 81 MG EC tablet TK 1 T PO QD (Patient not taking: Reported on 02/24/2021)  2   cetirizine (ZYRTEC) 10 MG tablet Take 1 tablet (10 mg total) by mouth in the morning and at bedtime. (Patient not taking:  Reported on 06/24/2021) 60 tablet 5   clobetasol ointment (TEMOVATE) 0.05 % Apply 1 application topically 2 (two) times daily. Take for no more than 1.5 weeks at a time (Patient not taking: Reported on 12/17/2020) 120 g 0   nitroGLYCERIN (NITROSTAT) 0.4 MG SL tablet  (Patient not taking: Reported on 06/24/2021)  3   triamcinolone ointment (KENALOG) 0.1 % Apply 1 application topically 2 (two) times daily. (Patient not taking: Reported on 06/24/2021) 80 g 5   No current facility-administered medications for this visit.     Known medication allergies: No Known Allergies   Physical examination (limited): Blood pressure 118/60, pulse 63, temperature 97.6 F (36.4 C), temperature source Temporal, resp. rate 20, height 5' 9.4" (1.763 m), weight 231 lb 9.6 oz (105.1 kg), SpO2 97 %.  General: Alert, interactive, in no acute distress. Skin: Warm and dry, without lesions or rashes. Extremities:  No clubbing, cyanosis or edema. Neuro:   Grossly intact.  Diagnositics/Labs: None today  Assessment and plan:   Hives (urticaria) Take the least the medication while remaining hive free Decrease to Zyrtec 1 tab in AM and Pepcid 1 tab in AM Stop evening dose of Zyrtec If you have return of hives or itching then go back to Zyrtec 1 tab in AM and PM  May use Benadryl (diphenhydramine) as needed for breakthrough hives  If symptoms return, then step up dosage  Keep a detailed symptom journal including foods eaten, contact with allergens, medications taken, weather changes.  Can use triamcinolone 0.1% ointment to red, itchy areas on body if effective If you do not have any improvement in your symptoms then would consider starting Xolair monthly injections  Alpha gal Continue to avoid mammalian meat and mammalian products.  In case of an allergic reaction, take Benadryl 50 mg every 4 hours, and if life-threatening symptoms occur, inject with EpiPen 0.3 mg.  Call the clinic if this treatment plan  is not working well for you.  Follow up in 6 months or sooner if needed.  I appreciate the opportunity to take part in Jay Ruiz's care. Please do not hesitate to contact me with questions.  Sincerely,   Margo Aye, MD Allergy/Immunology [facility]

## 2021-12-23 ENCOUNTER — Ambulatory Visit: Payer: No Typology Code available for payment source | Admitting: Allergy

## 2021-12-23 DIAGNOSIS — J309 Allergic rhinitis, unspecified: Secondary | ICD-10-CM

## 2022-01-31 ENCOUNTER — Encounter: Payer: Self-pay | Admitting: Family Medicine

## 2022-01-31 NOTE — Progress Notes (Deleted)
   [address] [place] [address] Dept: [phone]  FOLLOW UP NOTE  Patient ID: Jay Ruiz, male    DOB: [birth year]  Age: 46 y.o. MRN: [id number] Date of Office Visit: 02/01/2022  Assessment  Chief Complaint: No chief complaint on file.  HPI Jay Ruiz is a 46 year old male who presents to the clinic for follow-up visit.  He was last seen in this clinic on 06/24/2021 by Dr. Delorse Lek for evaluation of chronic urticaria and alpha gal allergy.   Drug Allergies:  No Known Allergies  Physical Exam: There were no vitals taken for this visit.   Physical Exam  Diagnostics:    Assessment and Plan: No diagnosis found.  No orders of the defined types were placed in this encounter.   There are no Patient Instructions on file for this visit.  No follow-ups on file.    Thank you for the opportunity to care for this patient.  Please do not hesitate to contact me with questions.  Thermon Leyland, FNP [facility]

## 2022-02-01 ENCOUNTER — Ambulatory Visit: Payer: No Typology Code available for payment source | Admitting: Family Medicine

## 2022-02-01 DIAGNOSIS — J309 Allergic rhinitis, unspecified: Secondary | ICD-10-CM
